# Patient Record
Sex: Male | Born: 1955 | Hispanic: Yes | Marital: Single
Health system: Southern US, Community
[De-identification: ages and names within clinical notes are randomized; demographics above are authoritative.]

## PROBLEM LIST (undated history)

## (undated) DIAGNOSIS — E119 Type 2 diabetes mellitus without complications: Secondary | ICD-10-CM

---

## 2008-12-01 ENCOUNTER — Ambulatory Visit: Payer: Self-pay | Admitting: Family Medicine

## 2010-03-11 ENCOUNTER — Ambulatory Visit: Payer: Self-pay | Admitting: Family Medicine

## 2010-05-30 ENCOUNTER — Emergency Department: Payer: Self-pay | Admitting: Emergency Medicine

## 2017-10-16 ENCOUNTER — Emergency Department
Admission: EM | Admit: 2017-10-16 | Discharge: 2017-10-16 | Disposition: A | Discharge status: Home / Self Care | Payer: BLUE CROSS/BLUE SHIELD | Attending: Emergency Medicine | Admitting: Emergency Medicine

## 2017-10-16 ENCOUNTER — Other Ambulatory Visit: Payer: Self-pay

## 2017-10-16 ENCOUNTER — Encounter: Payer: Self-pay | Admitting: Emergency Medicine

## 2017-10-16 DIAGNOSIS — B029 Zoster without complications: Secondary | ICD-10-CM | POA: Insufficient documentation

## 2017-10-16 DIAGNOSIS — E119 Type 2 diabetes mellitus without complications: Secondary | ICD-10-CM | POA: Insufficient documentation

## 2017-10-16 DIAGNOSIS — R21 Rash and other nonspecific skin eruption: Secondary | ICD-10-CM | POA: Diagnosis present

## 2017-10-16 HISTORY — DX: Type 2 diabetes mellitus without complications: E11.9

## 2017-10-16 MED ORDER — HYDROCODONE-ACETAMINOPHEN 5-325 MG PO TABS: 1.0000 | ORAL_TABLET | Freq: Four times a day (QID) | ORAL | 0 refills | Status: AC | PRN

## 2017-10-16 MED ORDER — VALACYCLOVIR HCL 1 G PO TABS: 1000.0000 mg | ORAL_TABLET | Freq: Three times a day (TID) | ORAL | 0 refills | Status: AC

## 2017-10-16 NOTE — ED Notes (Signed)
Pt ambulatory upon discharge; declined wheel chair. Verbalized understanding of discharge instructions, follow-up care and prescriptions. VSS. Skin warm and dry. A&O x4.  

## 2017-10-16 NOTE — ED Provider Notes (Signed)
[facility] Emergency Department Provider Note  ____________________________________________  Time seen: Approximately 5:39 PM  I have reviewed the triage vital signs and the nursing notes.   HISTORY  Chief Complaint Rash    HPI Raymond Higgins is a 62 y.o. male presents to the emergency department with an erythematous rash with vesicular formation along the right back and right side of abdomen that has been present for the past 2 to 3 days.  Patient describes a prodrome of burning and tingling.  No emesis or diarrhea.  No other family members with similar rash in the home.  No alleviating measures have been attempted.    Past Medical History:  Diagnosis Date  . Diabetes mellitus without complication (HCC)     There are no active problems to display for this patient.   History reviewed. No pertinent surgical history.  Prior to Admission medications   Medication Sig Start Date End Date Taking? Authorizing Provider  HYDROcodone-acetaminophen (NORCO) 5-325 MG tablet Take 1 tablet by mouth every 6 (six) hours as needed for up to 3 days for moderate pain. 10/16/17 10/19/17  Orvil FeilWoods, Jaclyn M, PA-C  valACYclovir (VALTREX) 1000 MG tablet Take 1 tablet (1,000 mg total) by mouth 3 (three) times daily for 10 days. 10/16/17 10/26/17  Orvil FeilWoods, Jaclyn M, PA-C    Allergies Patient has no known allergies.  No family history on file.  Social History Social History   Tobacco Use  . Smoking status: Not on file  Substance Use Topics  . Alcohol use: Not on file  . Drug use: Not on file     Review of Systems  Constitutional: No fever/chills Eyes: No visual changes. No discharge ENT: No upper respiratory complaints. Cardiovascular: no chest pain. Respiratory: no cough. No SOB. Gastrointestinal: No abdominal pain.  No nausea, no vomiting.  No diarrhea.  No constipation. Genitourinary: Negative for dysuria. No hematuria Musculoskeletal: Negative for  musculoskeletal pain. Skin: Patient has rash.  Neurological: Negative for headaches, focal weakness or numbness.  ____________________________________________   PHYSICAL EXAM:  VITAL SIGNS: ED Triage Vitals  Enc Vitals Group     BP 10/16/17 1523 (!) 170/87     Pulse Rate 10/16/17 1523 71     Resp 10/16/17 1523 20     Temp 10/16/17 1523 98.1 F (36.7 C)     Temp Source 10/16/17 1523 Oral     SpO2 10/16/17 1523 96 %     Weight 10/16/17 1524 180 lb (81.6 kg)     Height 10/16/17 1524 5\' 8"  (1.727 m)     Head Circumference --      Peak Flow --      Pain Score 10/16/17 1524 8     Pain Loc --      Pain Edu? --      Excl. in GC? --      Constitutional: Alert and oriented. Well appearing and in no acute distress. Eyes: Conjunctivae are normal. PERRL. EOMI. Head: Atraumatic. Cardiovascular: Normal rate, regular rhythm. Normal S1 and S2.  Good peripheral circulation. Respiratory: Normal respiratory effort without tachypnea or retractions. Lungs CTAB. Good air entry to the bases with no decreased or absent breath sounds. Musculoskeletal: Full range of motion to all extremities. No gross deformities appreciated. Neurologic:  Normal speech and language. No gross focal neurologic deficits are appreciated.  Skin: Patient has an erythematous rash with vesicular formation in a dermatomal distribution along the right back and right abdomen.  Rash does not cross the midline. Psychiatric:  Mood and affect are normal. Speech and behavior are normal. Patient exhibits appropriate insight and judgement.   ____________________________________________   LABS (all labs ordered are listed, but only abnormal results are displayed)  Labs Reviewed - No data to display ____________________________________________  EKG   ____________________________________________  RADIOLOGY   No results found.  ____________________________________________    PROCEDURES  Procedure(s) performed:     Procedures    Medications - No data to display   ____________________________________________   INITIAL IMPRESSION / ASSESSMENT AND PLAN / ED COURSE  Pertinent labs & imaging results that were available during my care of the patient were reviewed by me and considered in my medical decision making (see chart for details).  Review of the [place] CSRS was performed in accordance of the [facility] prior to dispensing any controlled drugs.   Assessment and plan Zoster Patient presents to the emergency department with an erythematous rash with vesicular formation along the right back and right abdomen in a dermatomal distribution without crossing the midline.  Patient describes a prodrome of burning and tingling.  History and physical exam findings are consistent with zoster.  Patient was discharged with Valtrex.  Vital signs are reassuring prior to discharge.  All patient questions were answered.      ____________________________________________  FINAL CLINICAL IMPRESSION(S) / ED DIAGNOSES  Final diagnoses:  Herpes zoster without complication      NEW MEDICATIONS STARTED DURING THIS VISIT:  ED Discharge Orders        Ordered    valACYclovir (VALTREX) 1000 MG tablet  3 times daily     10/16/17 1622    HYDROcodone-acetaminophen (NORCO) 5-325 MG tablet  Every 6 hours PRN     10/16/17 1622          This chart was dictated using voice recognition software/Dragon. Despite best efforts to proofread, errors can occur which can change the meaning. Any change was purely unintentional.    Orvil Feil, PA-C 10/16/17 1745    Phineas Semen, MD 10/16/17 [phone]

## 2017-10-16 NOTE — ED Triage Notes (Signed)
Rash R back x 5 days.

## 2019-07-28 ENCOUNTER — Ambulatory Visit: Payer: BC Managed Care – PPO | Attending: Internal Medicine

## 2019-07-28 DIAGNOSIS — Z23 Encounter for immunization: Secondary | ICD-10-CM

## 2019-07-28 NOTE — Progress Notes (Signed)
   Covid-19 Vaccination Clinic  Name:  Raymond Higgins    MRN: [id number] DOB: [birth year]  07/28/2019  [name] was observed post Covid-19 immunization for 15 minutes without incident. He was provided with Vaccine Information Sheet and instruction to access the V-Safe system.   [name] was instructed to call 911 with any severe reactions post vaccine: Marland Kitchen Difficulty breathing  . Swelling of face and throat  . A fast heartbeat  . A bad rash all over body  . Dizziness and weakness   Immunizations Administered    Name Date Dose VIS Date Route   Pfizer COVID-19 Vaccine 07/28/2019  9:24 AM 0.3 mL 04/07/2019 Intramuscular   Manufacturer: [employer], [employer]   Lot: [phone] [id number]   NDC: [address]-2

## 2019-08-23 ENCOUNTER — Ambulatory Visit: Payer: BC Managed Care – PPO | Attending: Internal Medicine

## 2019-08-23 DIAGNOSIS — Z23 Encounter for immunization: Secondary | ICD-10-CM

## 2019-08-23 NOTE — Progress Notes (Signed)
   Covid-19 Vaccination Clinic  Name:  Nikan Ellingson    MRN: [id number] DOB: 1955/09/29  08/23/2019  Mr. Kinnett was observed post Covid-19 immunization for 15 minutes without incident. He was provided with Vaccine Information Sheet and instruction to access the V-Safe system.   Mr. Zeiss was instructed to call 911 with any severe reactions post vaccine: Marland Kitchen Difficulty breathing  . Swelling of face and throat  . A fast heartbeat  . A bad rash all over body  . Dizziness and weakness   Immunizations Administered    Name Date Dose VIS Date Route   Pfizer COVID-19 Vaccine 08/23/2019  8:35 AM 0.3 mL 06/21/2018 Intramuscular   Manufacturer: [employer], [employer]   Lot: [id number]   NDC: [address]-9

## 2021-09-01 ENCOUNTER — Emergency Department
Admission: EM | Admit: 2021-09-01 | Discharge: 2021-09-01 | Disposition: A | Discharge status: Home / Self Care | Payer: BC Managed Care – PPO | Attending: Emergency Medicine | Admitting: Emergency Medicine

## 2021-09-01 ENCOUNTER — Emergency Department: Payer: BC Managed Care – PPO

## 2021-09-01 ENCOUNTER — Other Ambulatory Visit: Payer: Self-pay

## 2021-09-01 DIAGNOSIS — R0789 Other chest pain: Secondary | ICD-10-CM | POA: Insufficient documentation

## 2021-09-01 DIAGNOSIS — Y92410 Unspecified street and highway as the place of occurrence of the external cause: Secondary | ICD-10-CM | POA: Insufficient documentation

## 2021-09-01 DIAGNOSIS — R55 Syncope and collapse: Secondary | ICD-10-CM | POA: Insufficient documentation

## 2021-09-01 DIAGNOSIS — E119 Type 2 diabetes mellitus without complications: Secondary | ICD-10-CM | POA: Diagnosis not present

## 2021-09-01 DIAGNOSIS — S0990XA Unspecified injury of head, initial encounter: Secondary | ICD-10-CM | POA: Diagnosis present

## 2021-09-01 MED ORDER — CYCLOBENZAPRINE HCL 5 MG PO TABS: 5.0000 mg | ORAL_TABLET | Freq: Three times a day (TID) | ORAL | 0 refills | Status: AC | PRN
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
